# Patient Record
Sex: Female | Born: 1945 | Marital: Married | Smoking: Never smoker
Health system: Southern US, Community
[De-identification: ages and names within clinical notes are randomized; demographics above are authoritative.]

---

## 2014-04-16 ENCOUNTER — Encounter (HOSPITAL_COMMUNITY): Payer: Self-pay

## 2014-04-16 ENCOUNTER — Emergency Department (HOSPITAL_COMMUNITY)
Admission: EM | Admit: 2014-04-16 | Discharge: 2014-04-16 | Disposition: A | Discharge status: Home / Self Care | Payer: Self-pay | Attending: Emergency Medicine | Admitting: Emergency Medicine

## 2014-04-16 ENCOUNTER — Emergency Department (HOSPITAL_COMMUNITY): Payer: Self-pay

## 2014-04-16 DIAGNOSIS — R109 Unspecified abdominal pain: Secondary | ICD-10-CM

## 2014-04-16 DIAGNOSIS — B349 Viral infection, unspecified: Secondary | ICD-10-CM | POA: Insufficient documentation

## 2014-04-16 DIAGNOSIS — R079 Chest pain, unspecified: Secondary | ICD-10-CM | POA: Insufficient documentation

## 2014-04-16 DIAGNOSIS — R0789 Other chest pain: Secondary | ICD-10-CM

## 2014-04-16 LAB — URINALYSIS, ROUTINE W REFLEX MICROSCOPIC
BILIRUBIN URINE: NEGATIVE
GLUCOSE, UA: NEGATIVE mg/dL
HGB URINE DIPSTICK: NEGATIVE
Ketones, ur: NEGATIVE mg/dL
Nitrite: NEGATIVE
PH: 7.5 (ref 5.0–8.0)
Protein, ur: NEGATIVE mg/dL
Specific Gravity, Urine: 1.015 (ref 1.005–1.030)
UROBILINOGEN UA: 0.2 mg/dL (ref 0.0–1.0)

## 2014-04-16 LAB — COMPREHENSIVE METABOLIC PANEL
ALBUMIN: 3.8 g/dL (ref 3.5–5.2)
ALT: 9 U/L (ref 0–35)
AST: 18 U/L (ref 0–37)
Alkaline Phosphatase: 107 U/L (ref 39–117)
Anion gap: 15 (ref 5–15)
BILIRUBIN TOTAL: 0.6 mg/dL (ref 0.3–1.2)
BUN: 13 mg/dL (ref 6–23)
CO2: 22 mEq/L (ref 19–32)
Calcium: 9.7 mg/dL (ref 8.4–10.5)
Chloride: 103 mEq/L (ref 96–112)
Creatinine, Ser: 0.76 mg/dL (ref 0.50–1.10)
GFR calc Af Amer: >90 mL/min (ref >90)
GFR calc non Af Amer: 84 mL/min — ABNORMAL LOW (ref >90)
Glucose, Bld: 102 mg/dL — ABNORMAL HIGH (ref 70–99)
Potassium: 4.2 mEq/L (ref 3.7–5.3)
SODIUM: 140 meq/L (ref 137–147)
TOTAL PROTEIN: 8.1 g/dL (ref 6.0–8.3)

## 2014-04-16 LAB — CBC WITH DIFFERENTIAL/PLATELET
BASOS PCT: 0 % (ref 0–1)
Basophils Absolute: 0 10*3/uL (ref 0.0–0.1)
EOS ABS: 0 10*3/uL (ref 0.0–0.7)
Eosinophils Relative: 0 % (ref 0–5)
HCT: 40.7 % (ref 36.0–46.0)
Hemoglobin: 13.5 g/dL (ref 12.0–15.0)
Lymphocytes Relative: 20 % (ref 12–46)
Lymphs Abs: 1.3 10*3/uL (ref 0.7–4.0)
MCH: 28.4 pg (ref 26.0–34.0)
MCHC: 33.2 g/dL (ref 30.0–36.0)
MCV: 85.7 fL (ref 78.0–100.0)
Monocytes Absolute: 0.2 10*3/uL (ref 0.1–1.0)
Monocytes Relative: 3 % (ref 3–12)
NEUTROS ABS: 4.9 10*3/uL (ref 1.7–7.7)
NEUTROS PCT: 77 % (ref 43–77)
Platelets: 204 10*3/uL (ref 150–400)
RBC: 4.75 MIL/uL (ref 3.87–5.11)
RDW: 12.6 % (ref 11.5–15.5)
WBC: 6.5 10*3/uL (ref 4.0–10.5)

## 2014-04-16 LAB — I-STAT TROPONIN, ED: Troponin i, poc: 0.01 ng/mL (ref 0.00–0.08)

## 2014-04-16 LAB — URINE MICROSCOPIC-ADD ON

## 2014-04-16 LAB — LIPASE, BLOOD: Lipase: 27 U/L (ref 11–59)

## 2014-04-16 MED ORDER — IOHEXOL 350 MG/ML SOLN
80.0000 mL | Freq: Once | INTRAVENOUS | Status: AC | PRN
  Administered 2014-04-16: 80 mL via INTRAVENOUS

## 2014-04-16 MED ORDER — SODIUM CHLORIDE 0.9 % IV BOLUS (SEPSIS)
1000.0000 mL | Freq: Once | INTRAVENOUS | Status: AC
  Administered 2014-04-16: 1000 mL via INTRAVENOUS

## 2014-04-16 MED ORDER — ATENOLOL 25 MG PO TABS: 25.0000 mg | ORAL_TABLET | Freq: Every day | ORAL | Status: AC

## 2014-04-16 MED ORDER — PROMETHAZINE HCL 25 MG PO TABS: 12.5000 mg | ORAL_TABLET | Freq: Four times a day (QID) | ORAL | Status: AC | PRN

## 2014-04-16 MED ORDER — LORAZEPAM 1 MG PO TABS: 1.0000 mg | ORAL_TABLET | Freq: Three times a day (TID) | ORAL | Status: AC | PRN

## 2014-04-16 NOTE — ED Notes (Signed)
Pt from home with daughter. Visiting from [place] since sept. Thursday night started having gas feeling and took some pepto. Had 5 episodes of watery diarrhea that night. Friday started having epigastric pain, nauseated, and worse pain when lying down. Since then has had a poor appetite, no urinary sx. No vomiting. Does have a HA at this time also.

## 2014-04-16 NOTE — ED Notes (Signed)
Kaitlyn, PA at the bedside.  

## 2014-04-16 NOTE — ED Provider Notes (Signed)
CSN: [id number]     Arrival date & time 04/16/14  1019 History   First MD Initiated Contact with Patient 04/16/14 1037     Chief Complaint  Patient presents with  . Abdominal Pain     (Consider location/radiation/quality/duration/timing/severity/associated sxs/prior Treatment) HPI Comments: Patient is a 68 year old female with no past medical history who presents with diarrhea, nausea and generalized weakness that started 4 night ago. The diarrhea was watery and patient reports 5 episodes 4 night ago. After the diarrhea, patient complained of epigastric pain that radiated into her central chest. The pain is burning and moderate and worse with lying down. The pain improved with pepto bismol. Patient reports associated nausea and poor appetite and headache. No known sick contacts. No aggravating/alleviating factors. Patient is visiting from [place] and has been here since September.    History reviewed. No pertinent past medical history. History reviewed. No pertinent past surgical history. No family history on file. History  Substance Use Topics  . Smoking status: Never Smoker   . Smokeless tobacco: Not on file  . Alcohol Use: No   OB History    No data available     Review of Systems  Constitutional: Negative for fever, chills and fatigue.  HENT: Negative for trouble swallowing.   Eyes: Negative for visual disturbance.  Respiratory: Negative for shortness of breath.   Cardiovascular: Positive for chest pain. Negative for palpitations.  Gastrointestinal: Positive for abdominal pain and diarrhea. Negative for nausea and vomiting.  Genitourinary: Negative for dysuria and difficulty urinating.  Musculoskeletal: Negative for arthralgias and neck pain.  Skin: Negative for color change.  Neurological: Negative for dizziness and weakness.  Psychiatric/Behavioral: Negative for dysphoric mood.      Allergies  Review of patient's allergies indicates no known allergies.  Home  Medications   Prior to Admission medications   Not on File   BP 163/88 mmHg  Pulse 97  Temp(Src) 98.4 F (36.9 C) (Oral)  Resp 20  SpO2 98% Physical Exam  Constitutional: She is oriented to person, place, and time. She appears well-developed and well-nourished. No distress.  HENT:  Head: Normocephalic and atraumatic.  Eyes: Conjunctivae and EOM are normal.  Neck: Normal range of motion.  Cardiovascular: Normal rate and regular rhythm.  Exam reveals no gallop and no friction rub.   No murmur heard. Pulmonary/Chest: Effort normal and breath sounds normal. She has no wheezes. She has no rales. She exhibits no tenderness.  Abdominal: Soft. She exhibits no distension. There is tenderness. There is no rebound and no guarding.  Mild generalized tenderness to palpation without focal tenderness to palpation or peritoneal signs.   Musculoskeletal: Normal range of motion.  Neurological: She is alert and oriented to person, place, and time. Coordination normal.  Speech is goal-oriented. Moves limbs without ataxia.   Skin: Skin is warm and dry.  Psychiatric: She has a normal mood and affect. Her behavior is normal.  Nursing note and vitals reviewed.   ED Course  Procedures (including critical care time) Labs Review Labs Reviewed  COMPREHENSIVE METABOLIC PANEL - Abnormal; Notable for the following:    Glucose, Bld 102 (*)    GFR calc non Af Amer 84 (*)    All other components within normal limits  URINALYSIS, ROUTINE W REFLEX MICROSCOPIC - Abnormal; Notable for the following:    Leukocytes, UA SMALL (*)    All other components within normal limits  CBC WITH DIFFERENTIAL  LIPASE, BLOOD  URINE MICROSCOPIC-ADD ON  I-STAT  TROPOININ, ED    Imaging Review Ct Angio Chest Pe W/cm &/or Wo Cm  04/16/2014   CLINICAL DATA:  Patient with chest pain and lethargy. Recent long-term flight.  EXAM: CT ANGIOGRAPHY CHEST WITH CONTRAST  TECHNIQUE: Multidetector CT imaging of the chest was performed  using the standard protocol during bolus administration of intravenous contrast. Multiplanar CT image reconstructions and MIPs were obtained to evaluate the vascular anatomy.  CONTRAST:  80mL OMNIPAQUE IOHEXOL 350 MG/ML SOLN  COMPARISON:  Chest radiograph earlier same day.  FINDINGS: Adequate opacification of the main pulmonary artery. No evidence for pulmonary embolism.  Visualized neck base is unremarkable. No enlarged axillary, mediastinal or hilar lymphadenopathy. Multiple prominent sub cm mediastinal lymph nodes are visualized. Small hiatal hernia. Normal heart size. No pericardial effusion. Normal caliber aorta.  Central airways are patent. Dependent atelectasis within the bilateral lower lobes. 4 mm right middle lobe pulmonary nodule (image 36; series 6). There is an 11 mm spiculated nodule within the right lung apex (image 15; series 6). There is an additional adjacent 8 mm irregular nodule within the right lung apex (image 12; series 6). 5 mm right upper lobe pulmonary nodule (image 99; series 5). Right upper lobe calcified granulomas. Dependent atelectasis within the left lower lobe. 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  Visualization of the upper abdomen demonstrates no focal abnormality. No aggressive or acute appearing osseous lesions  Review of the MIP images confirms the above findings.  IMPRESSION: No evidence for pulmonary embolism.  Two adjacent irregular spiculated opacities within the right lung apex. While these may potentially represent sequelae of scarring, pulmonary malignancy could have a similar appearance. Recommend correlation with PET-CT.  These results were called by telephone at the time of interpretation on 04/16/2014 at 1:37 pm to Dr. Elwin Mocha , who verbally acknowledged these results.   Electronically Signed   By: Annia Belt M.D.   On: 04/16/2014 13:41   Dg Abd Acute W/chest  04/16/2014   CLINICAL DATA:  Abdominal pain with bloating for 3 days  EXAM:  ACUTE ABDOMEN SERIES (ABDOMEN 2 VIEW & CHEST 1 VIEW)  COMPARISON:  None.  FINDINGS: PA chest: There is underlying emphysematous change. There are scattered areas of mild scarring bilaterally. There is no edema or consolidation. Heart size and pulmonary vascularity are within normal limits. No adenopathy. There is smooth pleural thickening on the left laterally of uncertain etiology. This focal area of pleural thickening measures 4.4 x 1.8 cm.  Supine and upright abdomen: There is moderate stool throughout colon diffusely. No bowel dilatation or air-fluid level to suggest obstruction. No free air. No abnormal calcifications.  IMPRESSION: Moderate stool throughout colon. Bowel gas pattern unremarkable. No obstruction or free air.  No lung edema or consolidation. There is smooth pleural thickening along the inferolateral left hemithorax. This finding could represent residua of old trauma. A mass arising from the pleura could present in this manner, however. As there are no prior studies to compare, a contrast enhanced chest CT to further evaluate this area would be warranted.   Electronically Signed   By: Bretta Bang M.D.   On: 04/16/2014 11:33     EKG Interpretation None      MDM   Final diagnoses:  Chest pain  Abdominal pain  Viral illness    11:22 AM Labs and imaging pending. Patient will have fluids. Vitals stable and patient afebrile.   1:51 PM Patient's labs unremarkable for acute changes. Patient's chest xray and  acute abdominal series show no acute changes except for smooth pleural thickening along the inferolateral left hemithorax. Patient had a follow up chest CT for further evaluation which shows lipoma. CT also showed spiculated opacities in right lung apex. Patient advised to have follow up scan in 3 months. Patient likely has viral illness. Patient will have Phenergan for nausea, ativan for anxiety and atenolol for elevated BP per Dr. Fransico Himai's recommendation. Vitals stable and  patient afebrile. Patient instructed to return with worsening or concerning symptoms.   Emilia BeckKaitlyn Levon Penning, PA-C 04/16/14 1354  Elwin MochaBlair Walden, MD 04/16/14 [phone]

## 2014-04-16 NOTE — Discharge Instructions (Signed)
Take Ativan as needed for anxiety. Take Phenergan as needed for nausea. Take Atenolol as directed by Dr. Isidoro Donningai. Return to the ED with worsening or concerning symptoms. Follow up with for repeat chest CT in 3 months for re-evaluation of incidental findings.

## 2014-04-16 NOTE — ED Notes (Signed)
Pt returned from CT and amb to [facility].

## 2014-04-16 NOTE — ED Notes (Signed)
Pt transported to xray 

## 2015-09-13 IMAGING — CT CT ANGIO CHEST
2 of 8 series · 18 of 46 positions shown · IV contrast (Omni 300)
Comparison: Chest radiograph earlier same day.

CLINICAL DATA: Patient with chest pain and lethargy. Recent
long-term flight.

EXAM:
CT ANGIOGRAPHY CHEST WITH CONTRAST
TECHNIQUE: Multidetector CT imaging of the chest was performed using the
standard protocol during bolus administration of intravenous
contrast. Multiplanar CT image reconstructions and MIPs were
obtained to evaluate the vascular anatomy.
CONTRAST:  80mL OMNIPAQUE IOHEXOL 350 MG/ML SOLN

[Series 5: thins · axial · 0.53mm/px · z∈[+86,+286]mm · 15 of 222 slices shown]
[im 11/222  lung]
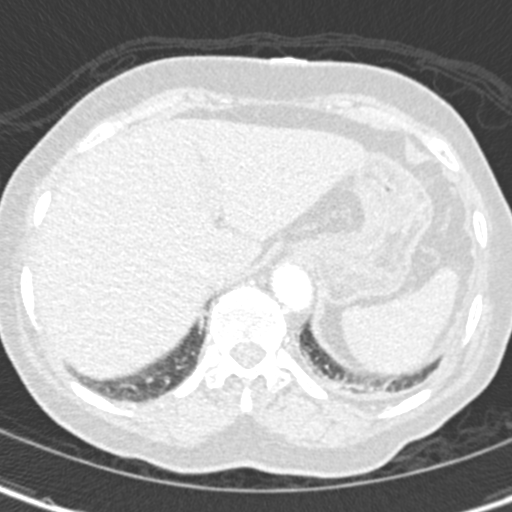
[im 31/222  soft-tissue]
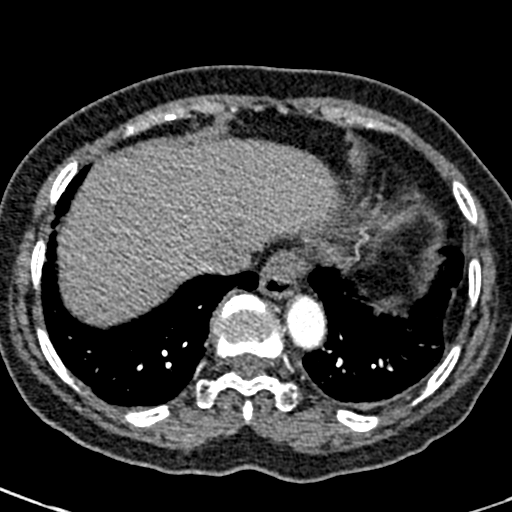
[im 41/222  lung]
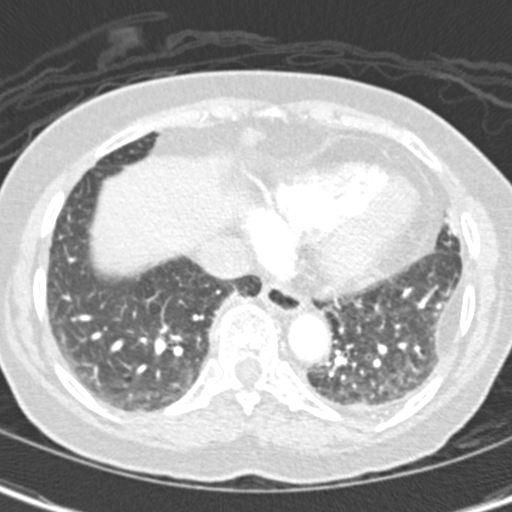
[im 51/222  soft-tissue]
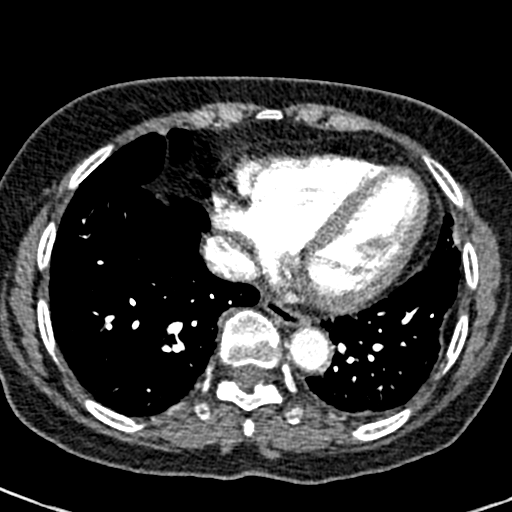
[im 71/222  lung]
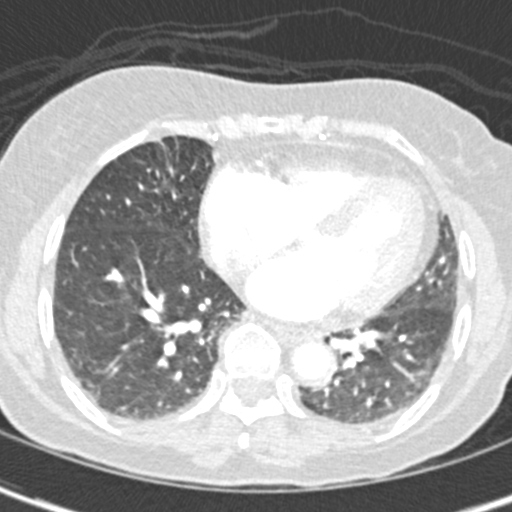
[im 81/222  soft-tissue]
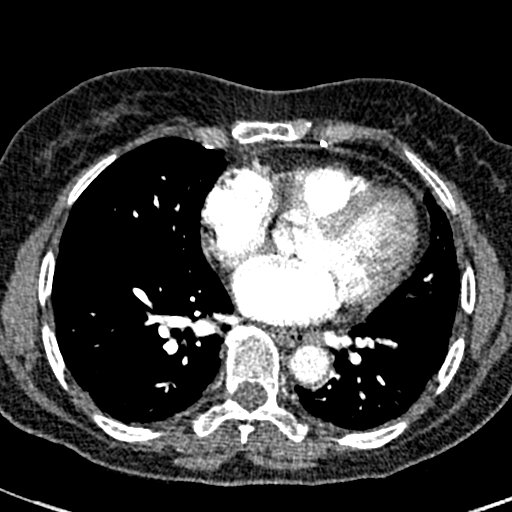
[im 101/222  lung]
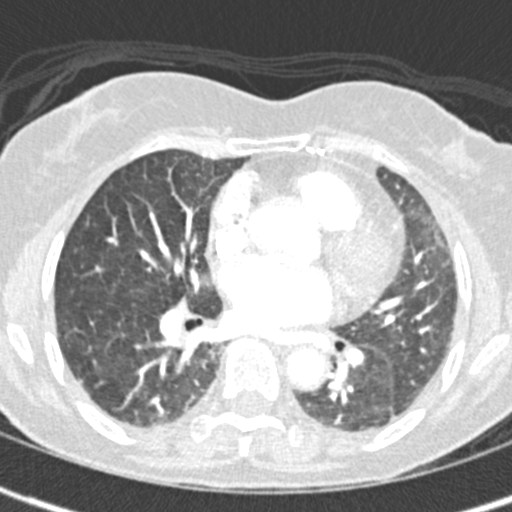
[im 111/222  soft-tissue]
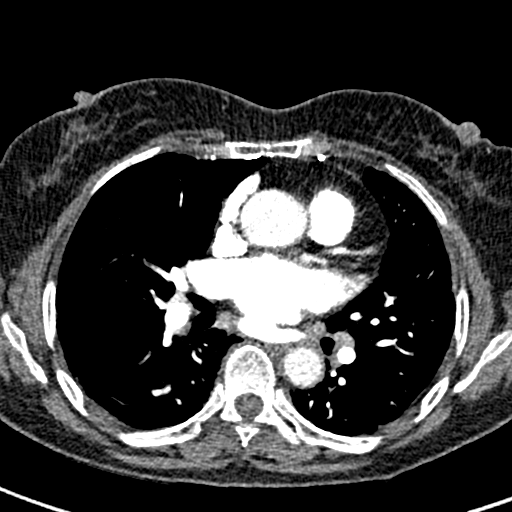
[im 121/222  lung]
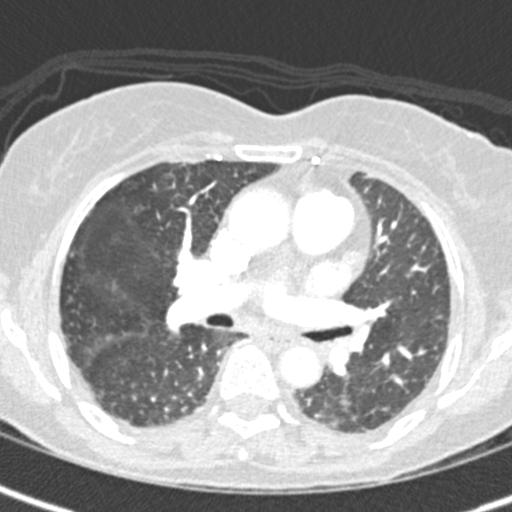
[im 141/222  soft-tissue]
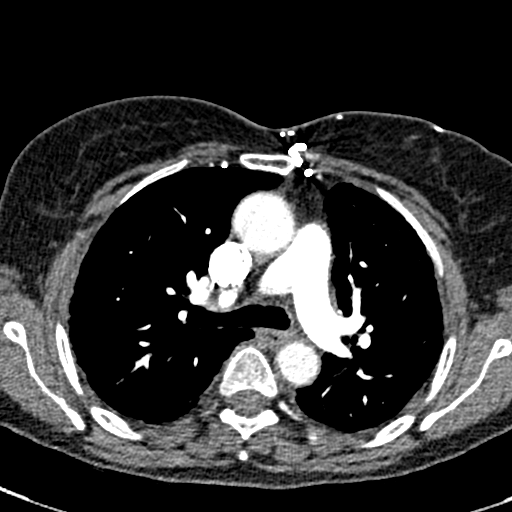
[im 151/222  lung]
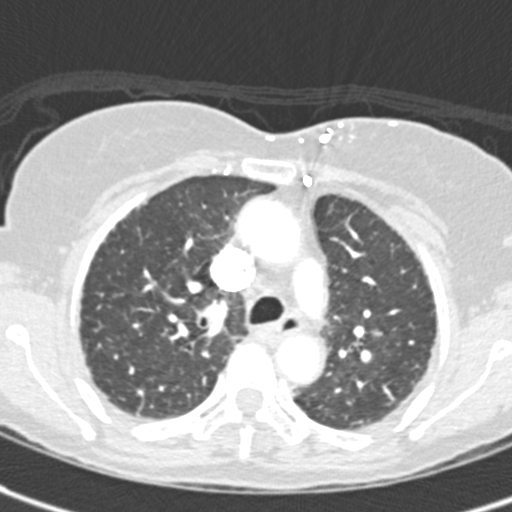
[im 171/222  soft-tissue]
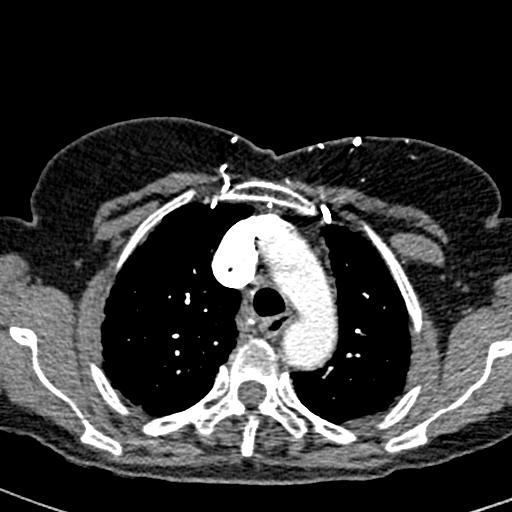
[im 181/222  lung]
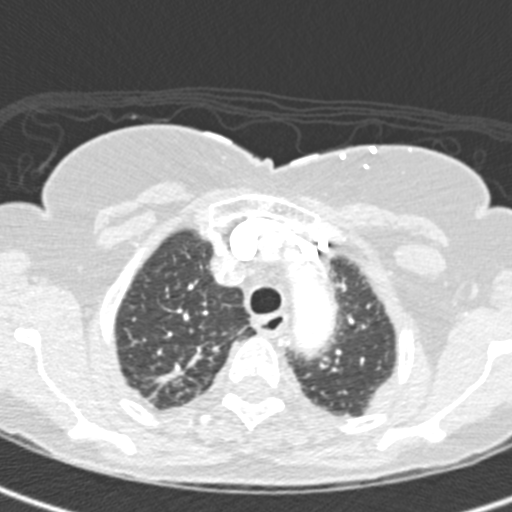
[im 191/222  soft-tissue]
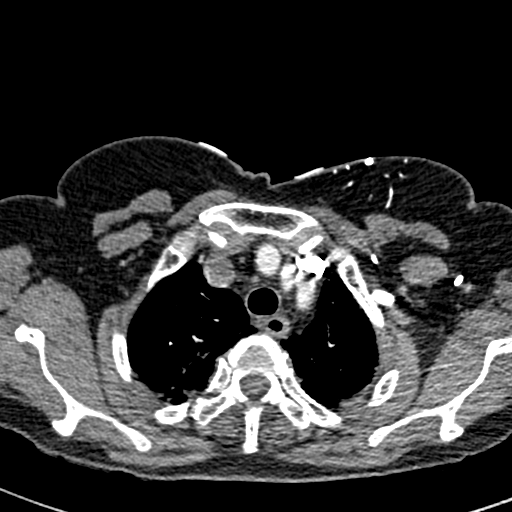
[im 211/222  lung]
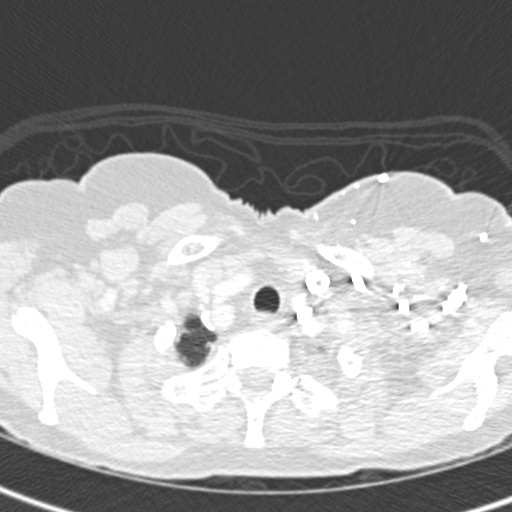

[Series 7: coronal mpr · coronal · 0.46mm/px · 3 of 103 slices shown]
[im 26/103  soft-tissue]
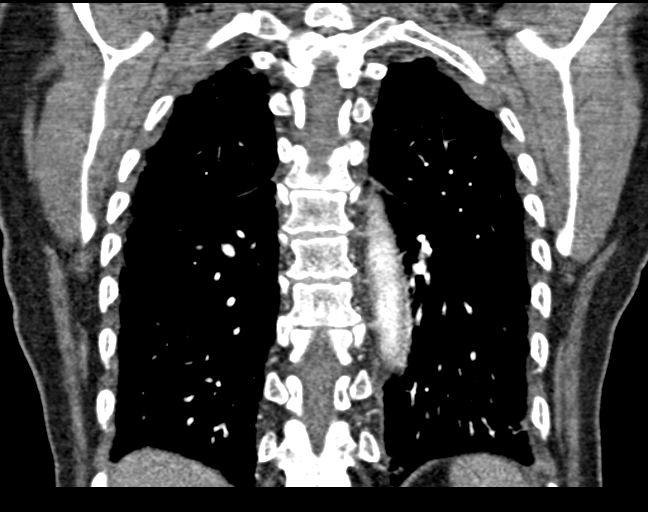
[im 52/103  soft-tissue]
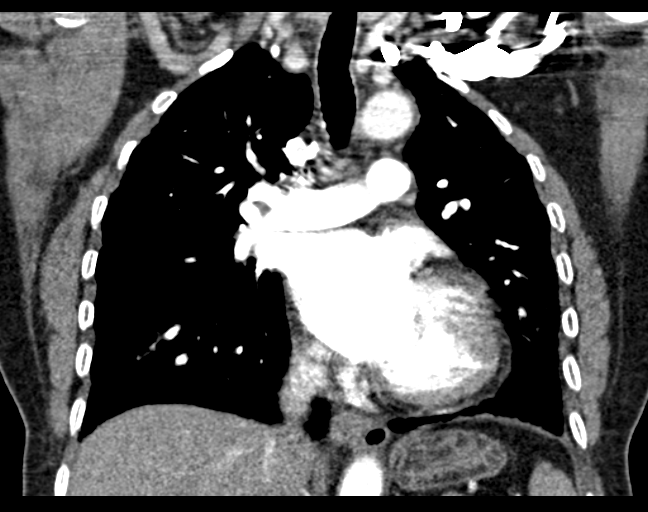
[im 77/103  soft-tissue]
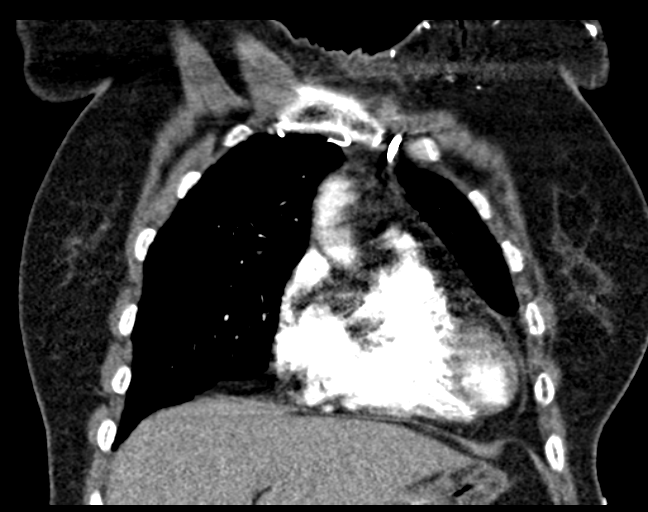

[18 of 46 positions shown; findings below may reference images not displayed]

FINDINGS: Adequate opacification of the main pulmonary artery. No evidence for
pulmonary embolism.

Visualized neck base is unremarkable. No enlarged axillary,
mediastinal or hilar lymphadenopathy. Multiple prominent sub cm
mediastinal lymph nodes are visualized. Small hiatal hernia. Normal
heart size. No pericardial effusion. Normal caliber aorta.

Central airways are patent. Dependent atelectasis within the
bilateral lower lobes. 4 mm right middle lobe pulmonary nodule
(image 36; series 6). There is an 11 mm spiculated nodule within the
right lung apex (image 15; series 6). There is an additional
adjacent 8 mm irregular nodule within the right lung apex (image 12;
series 6). 5 mm right upper lobe pulmonary nodule (image 99; series
5). Right upper lobe calcified granulomas. Dependent atelectasis
within the left lower lobe. Subpleural lipoma within the left lower
lateral hemi thorax.

Visualization of the upper abdomen demonstrates no focal
abnormality. No aggressive or acute appearing osseous lesions

Review of the MIP images confirms the above findings.
IMPRESSION: No evidence for pulmonary embolism.

Two adjacent irregular spiculated opacities within the right lung
apex. While these may potentially represent sequelae of scarring,
pulmonary malignancy could have a similar appearance. Recommend
correlation with PET-CT.

These results were called by telephone at the time of interpretation
on 04/16/2014 at [DATE] to Dr. MAGED GARZON , who verbally
acknowledged these results.

## 2015-09-13 IMAGING — CR DG ABDOMEN ACUTE W/ 1V CHEST
3 series · 3 of 3 positions shown · non-contrast
Comparison: None.

CLINICAL DATA: Abdominal pain with bloating for 3 days

EXAM:
ACUTE ABDOMEN SERIES (ABDOMEN 2 VIEW & CHEST 1 VIEW)

[chest pa]
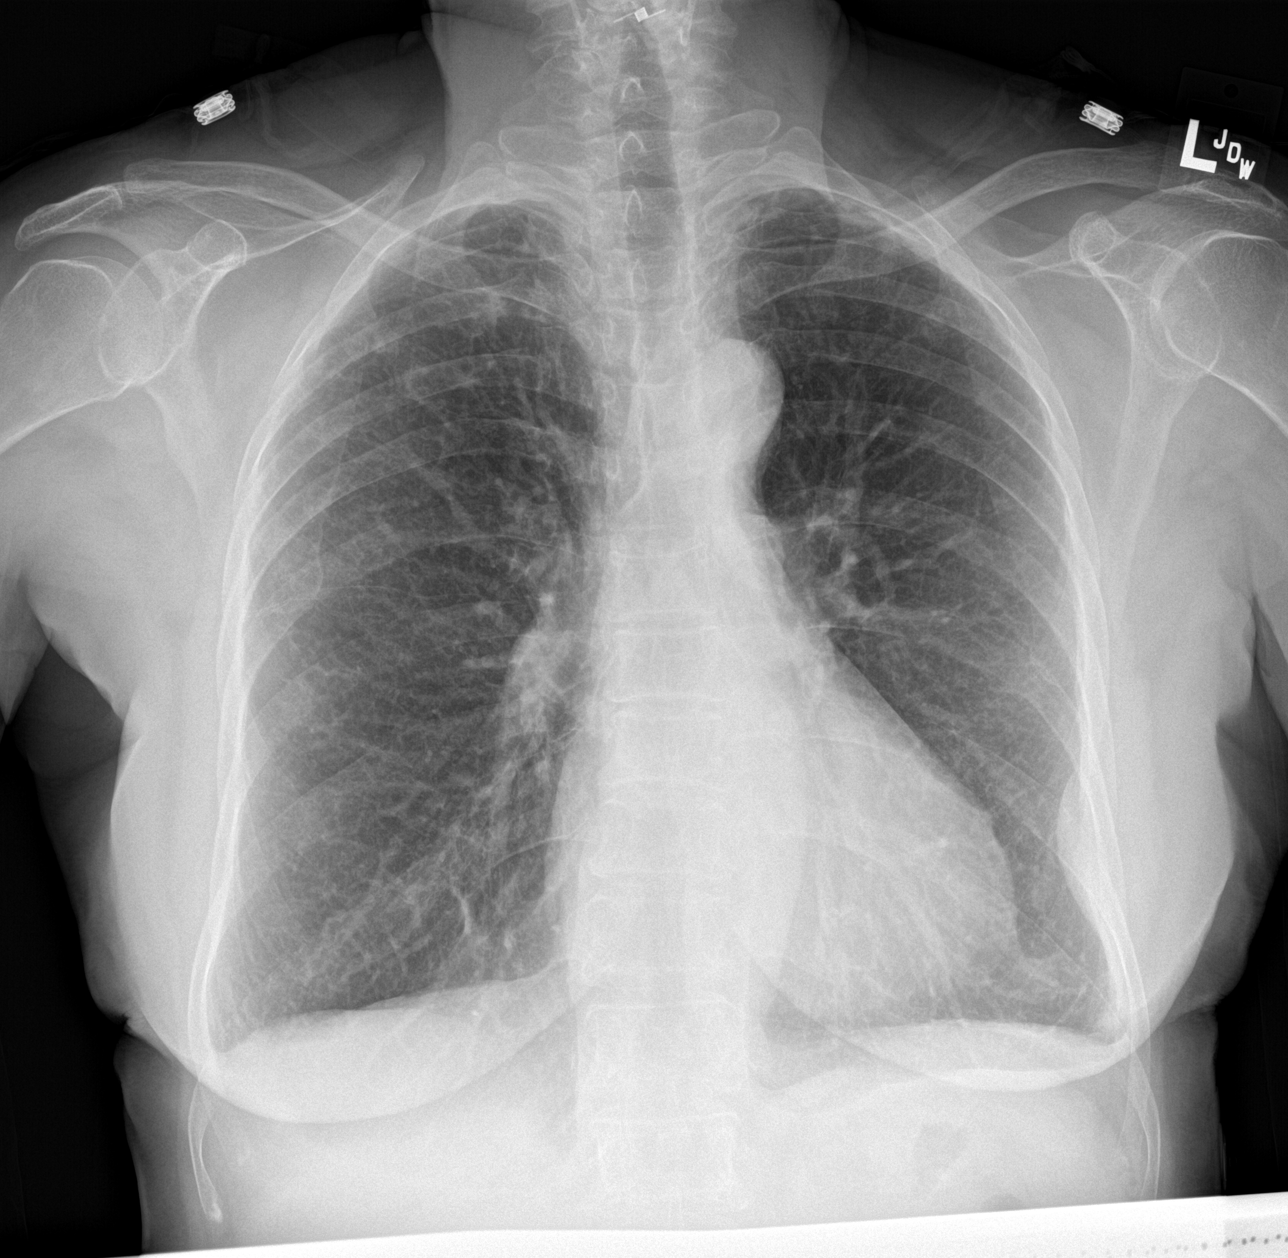

[abdomen erect]
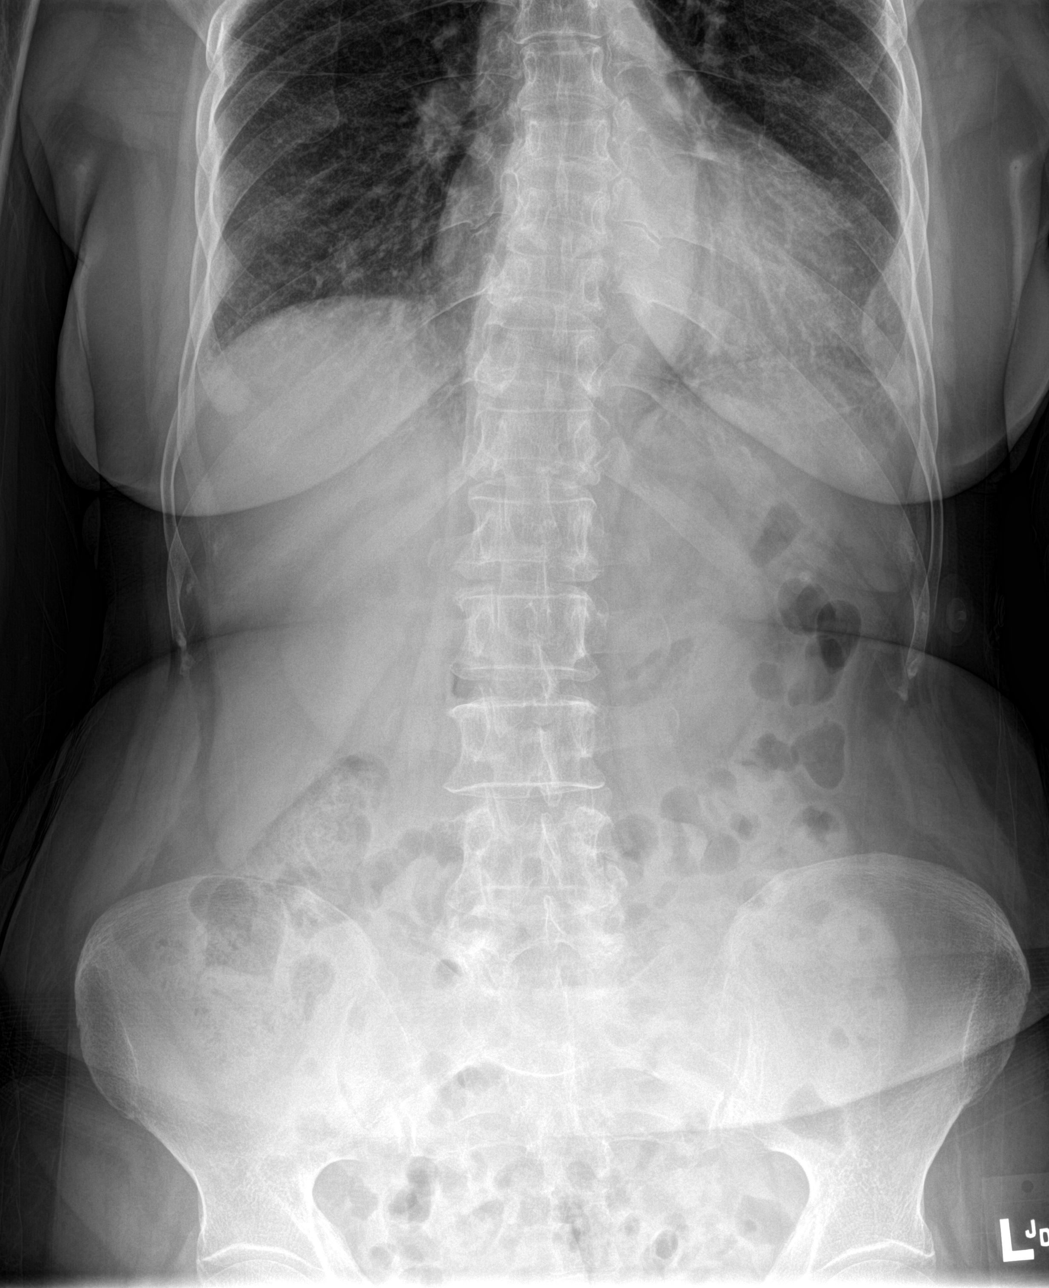

[abdomen supine]
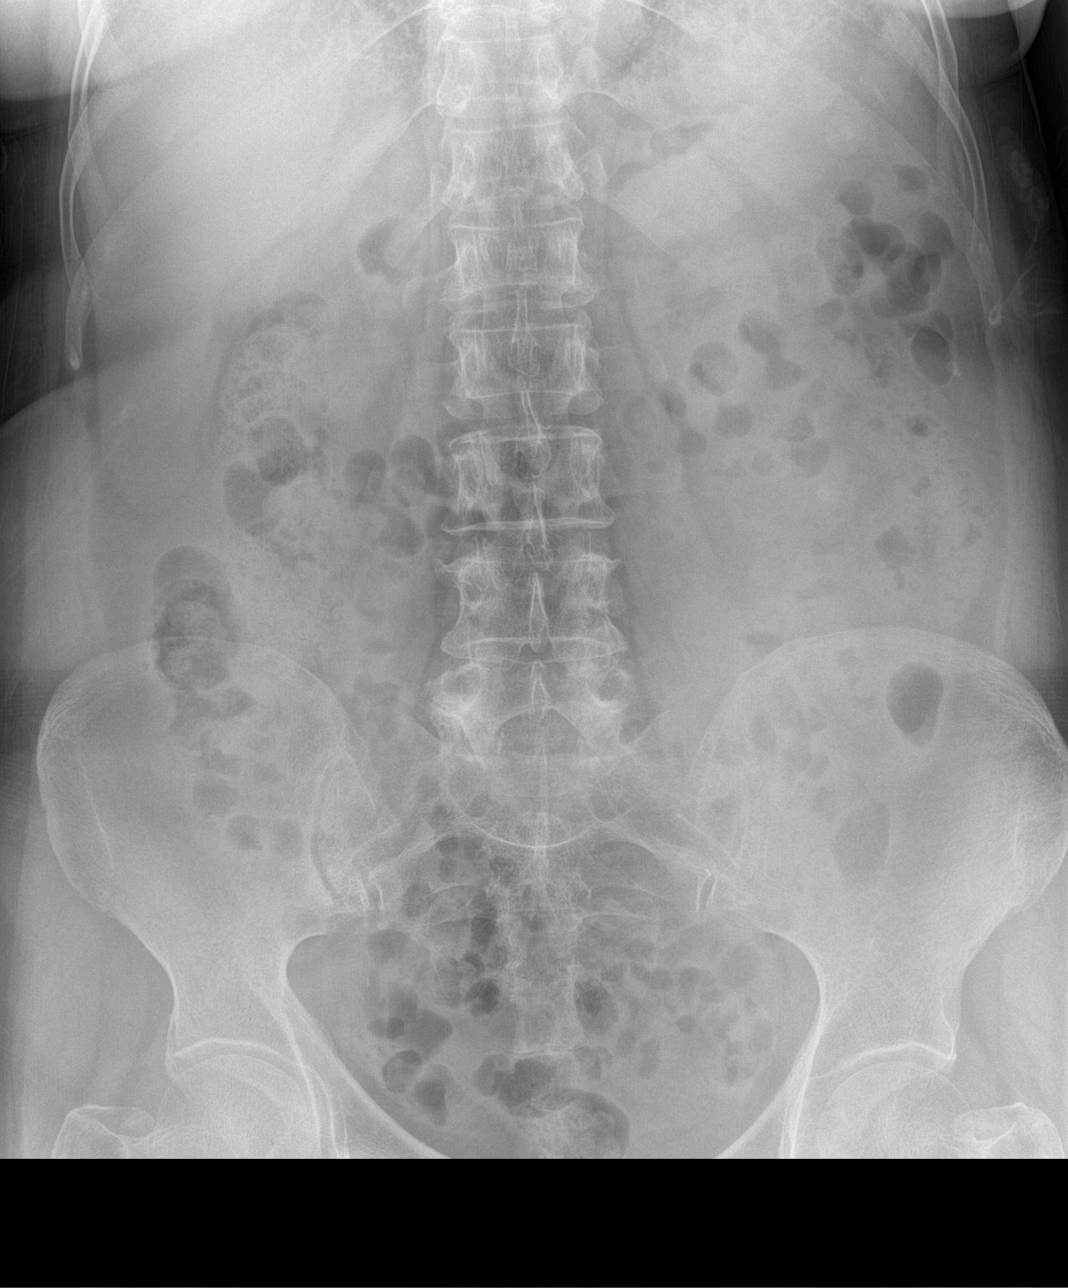

[3 of 3 positions shown; findings below may reference images not displayed]

FINDINGS: PA chest: There is underlying emphysematous change. There are
scattered areas of mild scarring bilaterally. There is no edema or
consolidation. Heart size and pulmonary vascularity are within
normal limits. No adenopathy. There is smooth pleural thickening on
the left laterally of uncertain etiology. This focal area of pleural
thickening measures 4.4 x 1.8 cm.

Supine and upright abdomen: There is moderate stool throughout colon
diffusely. No bowel dilatation or air-fluid level to suggest
obstruction. No free air. No abnormal calcifications.
IMPRESSION: Moderate stool throughout colon. Bowel gas pattern unremarkable. No
obstruction or free air.

No lung edema or consolidation. There is smooth pleural thickening
along the inferolateral left hemithorax. This finding could
represent residua of old trauma. A mass arising from the pleura
could present in this manner, however. As there are no prior studies
to compare, a contrast enhanced chest CT to further evaluate this
area would be warranted.
